# Patient Record
Sex: Male | Born: 1950 | Race: White | Hispanic: No | Marital: Married | Smoking: Never smoker
Health system: Southern US, Community
[De-identification: ages and names within clinical notes are randomized; demographics above are authoritative.]

---

## 2021-09-11 ENCOUNTER — Encounter: Payer: Self-pay | Admitting: Emergency Medicine

## 2021-09-11 ENCOUNTER — Emergency Department (INDEPENDENT_AMBULATORY_CARE_PROVIDER_SITE_OTHER)
Admission: EM | Admit: 2021-09-11 | Discharge: 2021-09-11 | Disposition: A | Discharge status: Home / Self Care | Payer: Medicare Other | Source: Home / Self Care | Attending: Family Medicine | Admitting: Family Medicine

## 2021-09-11 ENCOUNTER — Emergency Department (INDEPENDENT_AMBULATORY_CARE_PROVIDER_SITE_OTHER): Payer: Medicare Other

## 2021-09-11 DIAGNOSIS — L089 Local infection of the skin and subcutaneous tissue, unspecified: Secondary | ICD-10-CM | POA: Diagnosis not present

## 2021-09-11 DIAGNOSIS — E11628 Type 2 diabetes mellitus with other skin complications: Secondary | ICD-10-CM | POA: Diagnosis not present

## 2021-09-11 DIAGNOSIS — E11621 Type 2 diabetes mellitus with foot ulcer: Secondary | ICD-10-CM

## 2021-09-11 MED ORDER — DOXYCYCLINE HYCLATE 100 MG PO CAPS: 100.0000 mg | ORAL_CAPSULE | Freq: Two times a day (BID) | ORAL | 0 refills | Status: AC

## 2021-09-11 NOTE — Discharge Instructions (Signed)
Wash once or twice a day and change dressing Continue Levaquin until completed Add Vibramycin 1 pill 2 times a day.  It is important to take Vibramycin with food Consider adding a probiotic See your doctor as soon as you are home See any physician if you start getting worse instead of better, have increased swelling or pain, or have fever, uncontrolled sugars

## 2021-09-11 NOTE — ED Triage Notes (Signed)
Patient c/o left big toe infection x 7 days, redness, swelling and pain.  Patient has seen in PCP, given antibiotics and Mupirocin, not any better.

## 2021-09-11 NOTE — ED Provider Notes (Signed)
[facility]    CSN: [id number] Arrival date & time: 09/11/21  1308      History   Chief Complaint Chief Complaint  Patient presents with   Toe Pain    HPI [name] is a 71 y.o. male.   HPI  Pleasant 71 year old gentleman.  He and his wife are traveling in a camper.  He lives in [place].  He is on his way through [place] to a destination towards [place].  Stopping here today because he has a pain and swelling in his great toe.  He states that his left big toe has had an infection for 7 days.  He saw his PCP before he left on the trip.  He started him on Levaquin 750 mg a day.  He has been taking this every day for the last 5 or 6 days and the redness appears are getting worse, swelling is worse.  He states that he does not have any open wound.  He does not think that there is any body.  He called his primary care doctor who suggested that he be seen, and to get an x-ray.  He states his diabetes has been well controlled with a recent hemoglobin A1c of 6.7 Patient denies any fever or malaise or systemic symptoms History reviewed. No pertinent past medical history.  There are no problems to display for this patient.   History reviewed. No pertinent surgical history.     Home Medications    Prior to Admission medications   Medication Sig Start Date End Date Taking? Authorizing Provider  doxycycline (VIBRAMYCIN) 100 MG capsule Take 1 capsule (100 mg total) by mouth 2 (two) times daily. 09/11/21  Yes Troy Moore, MD  FARXIGA 10 MG TABS tablet Take 10 mg by mouth daily. 08/10/21  Yes [provider]  lisinopril (ZESTRIL) 5 MG tablet Take 5 mg by mouth daily. 08/10/21  Yes [provider]  meloxicam (MOBIC) 15 MG tablet Take 15 mg by mouth daily. 08/15/21  Yes [provider]  metFORMIN (GLUCOPHAGE) 500 MG tablet Take by mouth.   Yes [provider]  mupirocin ointment (BACTROBAN) 2 % Apply 1 Application topically 3  (three) times daily. 09/05/21  Yes [provider]  sildenafil (VIAGRA) 100 MG tablet Take 100 mg by mouth daily as needed. 04/05/21  Yes [provider]  tamsulosin (FLOMAX) 0.4 MG CAPS capsule Take 0.4 mg by mouth 2 (two) times daily. 07/17/21  Yes [provider]  zolpidem (AMBIEN) 10 MG tablet Take 10 mg by mouth daily. 09/05/21  Yes [provider]    Family History History reviewed. No pertinent family history.  Social History Social History   Tobacco Use   Smoking status: Never   Smokeless tobacco: Never  Substance Use Topics   Alcohol use: Never   Drug use: Never     Allergies   Patient has no known allergies.   Review of Systems Review of Systems See HPI  Physical Exam Triage Vital Signs ED Triage Vitals  Enc Vitals Group     BP 09/11/21 1327 129/81     Pulse Rate 09/11/21 1327 62     Resp 09/11/21 1327 18     Temp 09/11/21 1327 98.8 F (37.1 C)     Temp Source 09/11/21 1327 Oral     SpO2 09/11/21 1327 97 %     Weight 09/11/21 1329 168 lb (76.2 kg)     Height 09/11/21 1329 5\' 8"  (  1.727 m)     Head Circumference --      Peak Flow --      Pain Score 09/11/21 1329 3     Pain Loc --      Pain Edu? --      Excl. in GC? --    No data found.  Updated Vital Signs BP 129/81 (BP Location: Right Arm)   Pulse 62   Temp 98.8 F (37.1 C) (Oral)   Resp 18   Ht 5\' 8"  (1.727 m)   Wt 76.2 kg   SpO2 97%   BMI 25.54 kg/m        Physical Exam Constitutional:      General: He is not in acute distress.    Appearance: He is well-developed.  HENT:     Head: Normocephalic and atraumatic.  Eyes:     Conjunctiva/sclera: Conjunctivae normal.     Pupils: Pupils are equal, round, and reactive to light.  Cardiovascular:     Rate and Rhythm: Normal rate.  Pulmonary:     Effort: Pulmonary effort is normal. No respiratory distress.  Abdominal:     General: There is no distension.     Palpations: Abdomen is soft.  Musculoskeletal:         General: Normal range of motion.     Cervical back: Normal range of motion.       Feet:  Skin:    General: Skin is warm and dry.  Neurological:     General: No focal deficit present.     Mental Status: He is alert.  Psychiatric:        Mood and Affect: Mood normal.        Behavior: Behavior normal.      UC Treatments / Results  Labs (all labs ordered are listed, but only abnormal results are displayed) Labs Reviewed - No data to display  EKG    Radiology DG Foot 2 Views Left  Result Date: 09/11/2021 CLINICAL DATA:  Left big toe infection. EXAM: LEFT FOOT - 2 VIEW COMPARISON:  None Available. FINDINGS: There is no evidence of fracture or dislocation. There is no evidence of arthropathy or other focal bone abnormality. Diffuse soft tissue swelling of the first toe is noted suggesting cellulitis. IMPRESSION: Diffuse soft tissue swelling of the first toe suggesting cellulitis. No lytic destruction is seen to suggest osteomyelitis. Electronically Signed   By: 09/13/2021 M.D.   On: 09/11/2021 14:18    Procedures Procedures (including critical care time)  Medications Ordered in UC Medications - No data to display  Initial Impression / Assessment and Plan / UC Course  I have reviewed the triage vital signs and the nursing notes.  Pertinent labs & imaging results that were available during my care of the patient were reviewed by me and considered in my medical decision making (see chart for details).     Patient has soft tissue swelling and pain in the great toe with evidence of ulceration and infection.  He is not responding to 6 days of Levaquin 750 mg a day.  He does not have good MRSA coverage, so we will add doxycycline.  He needs to follow-up with his primary care doctor as soon as he is able.  He needs to obtain emergency care if his condition worsens. Final Clinical Impressions(s) / UC Diagnoses   Final diagnoses:  Diabetic foot infection (HCC)  Diabetic  ulcer of toe of left foot associated with type 2 diabetes mellitus,  limited to breakdown of skin [facility])     Discharge Instructions      Wash once or twice a day and change dressing Continue Levaquin until completed Add Vibramycin 1 pill 2 times a day.  It is important to take Vibramycin with food Consider adding a probiotic See your doctor as soon as you are home See any physician if you start getting worse instead of better, have increased swelling or pain, or have fever, uncontrolled sugars   ED Prescriptions     Medication Sig Dispense Auth. Provider   doxycycline (VIBRAMYCIN) 100 MG capsule Take 1 capsule (100 mg total) by mouth 2 (two) times daily. 14 capsule Troy Moore, MD      PDMP not reviewed this encounter.   Troy Moore, MD 09/11/21 1744

## 2023-03-04 IMAGING — DX DG FOOT 2V*L*
3 series · 3 of 3 positions shown · non-contrast
Comparison: None Available.

CLINICAL DATA: Left big toe infection.

EXAM:
LEFT FOOT - 2 VIEW

[foot ap]
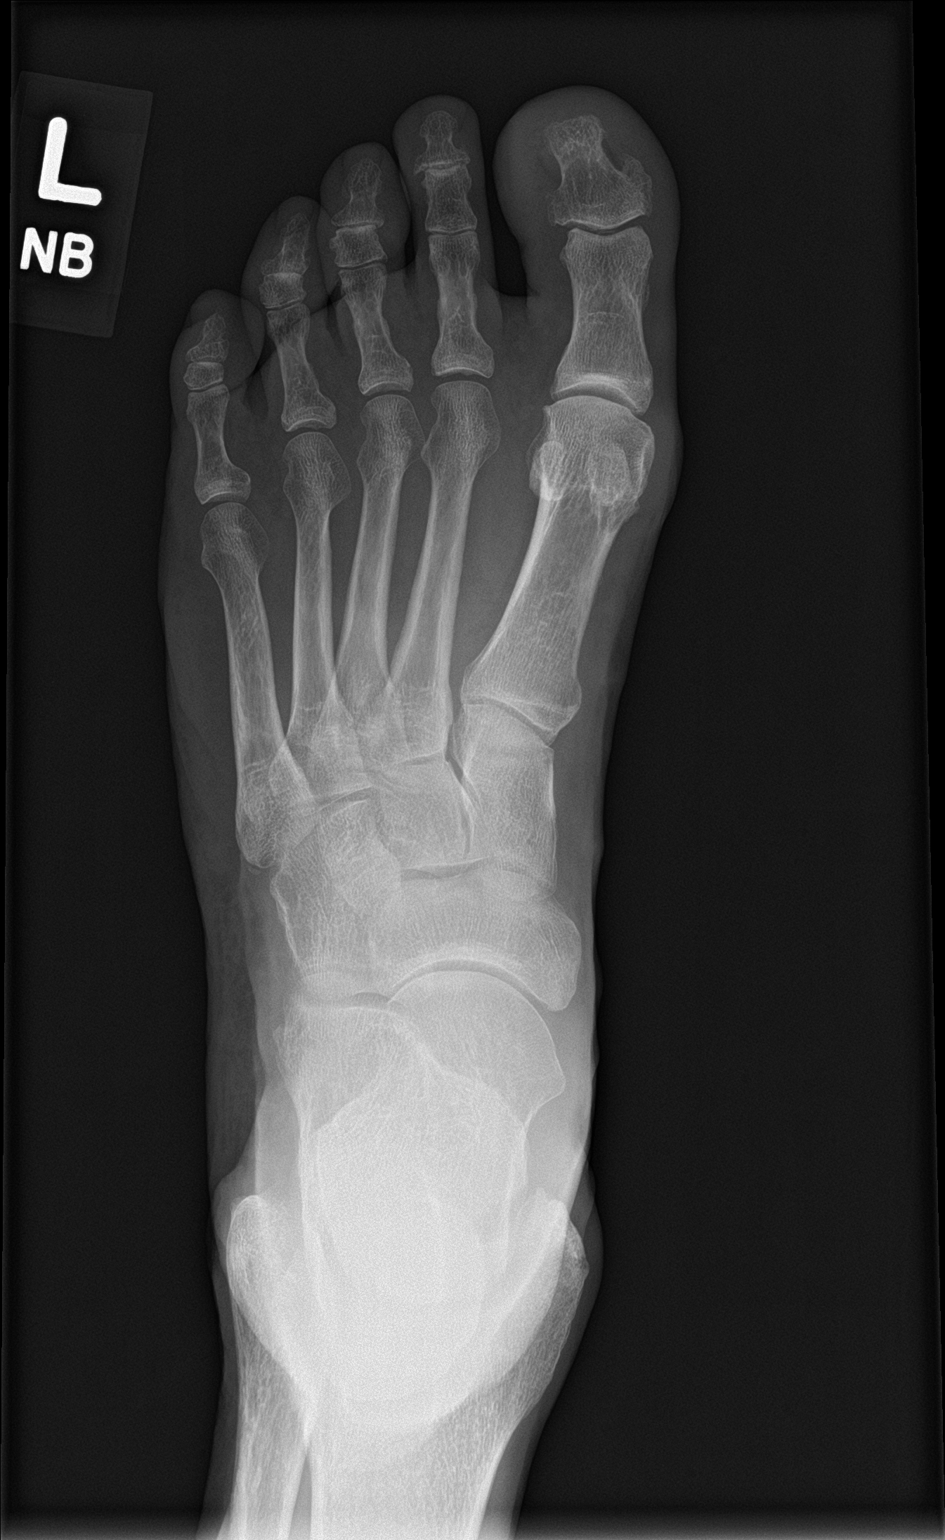

[foot lat]
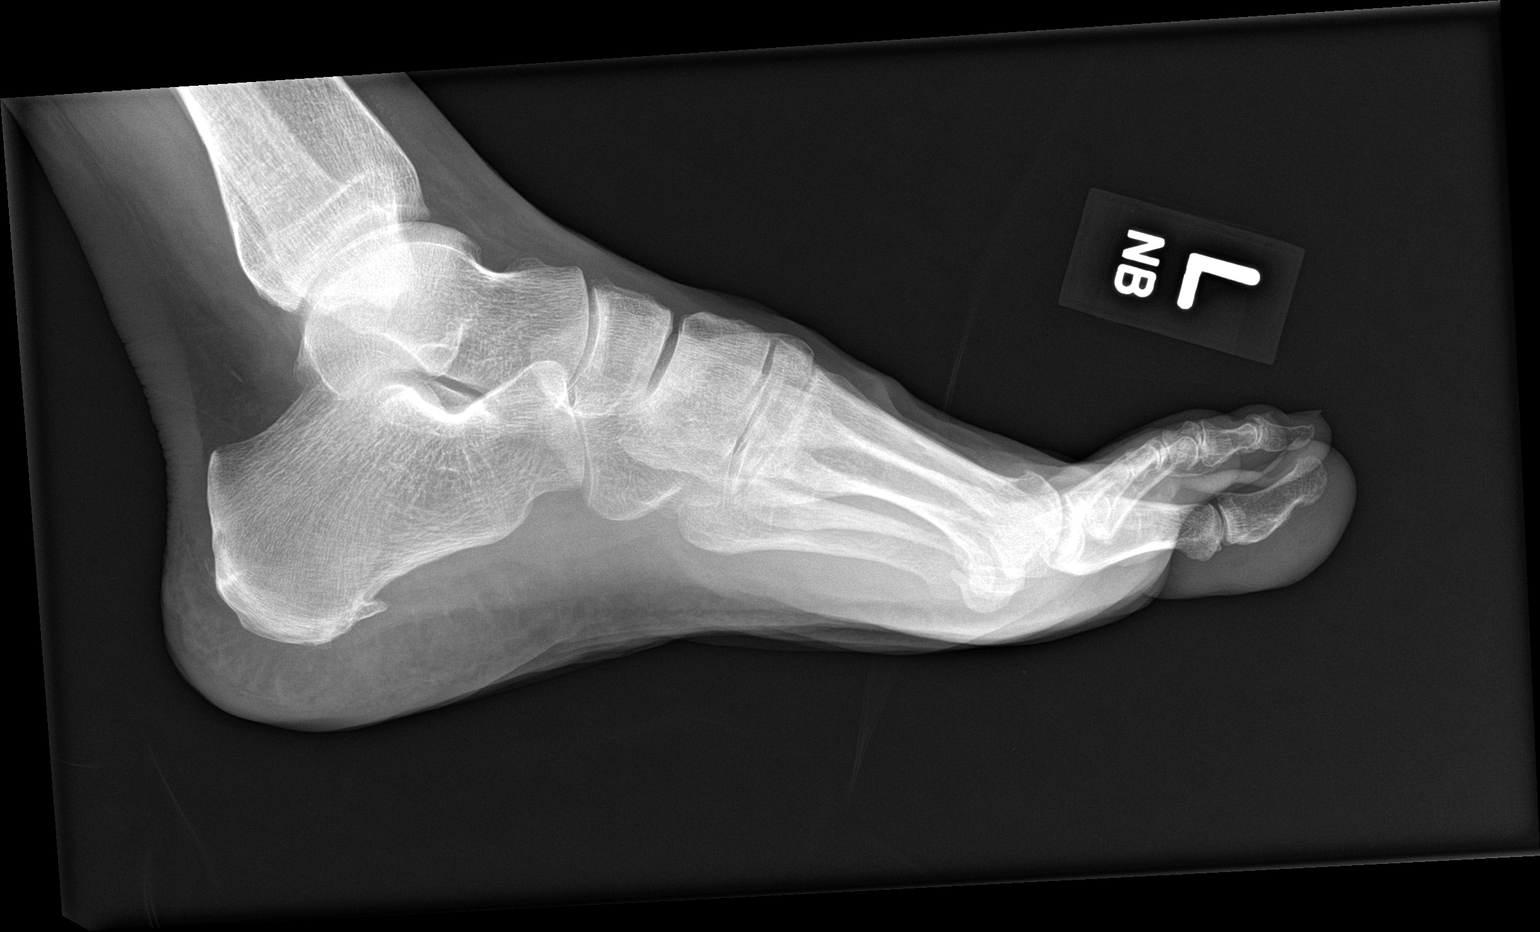

[toe lat]
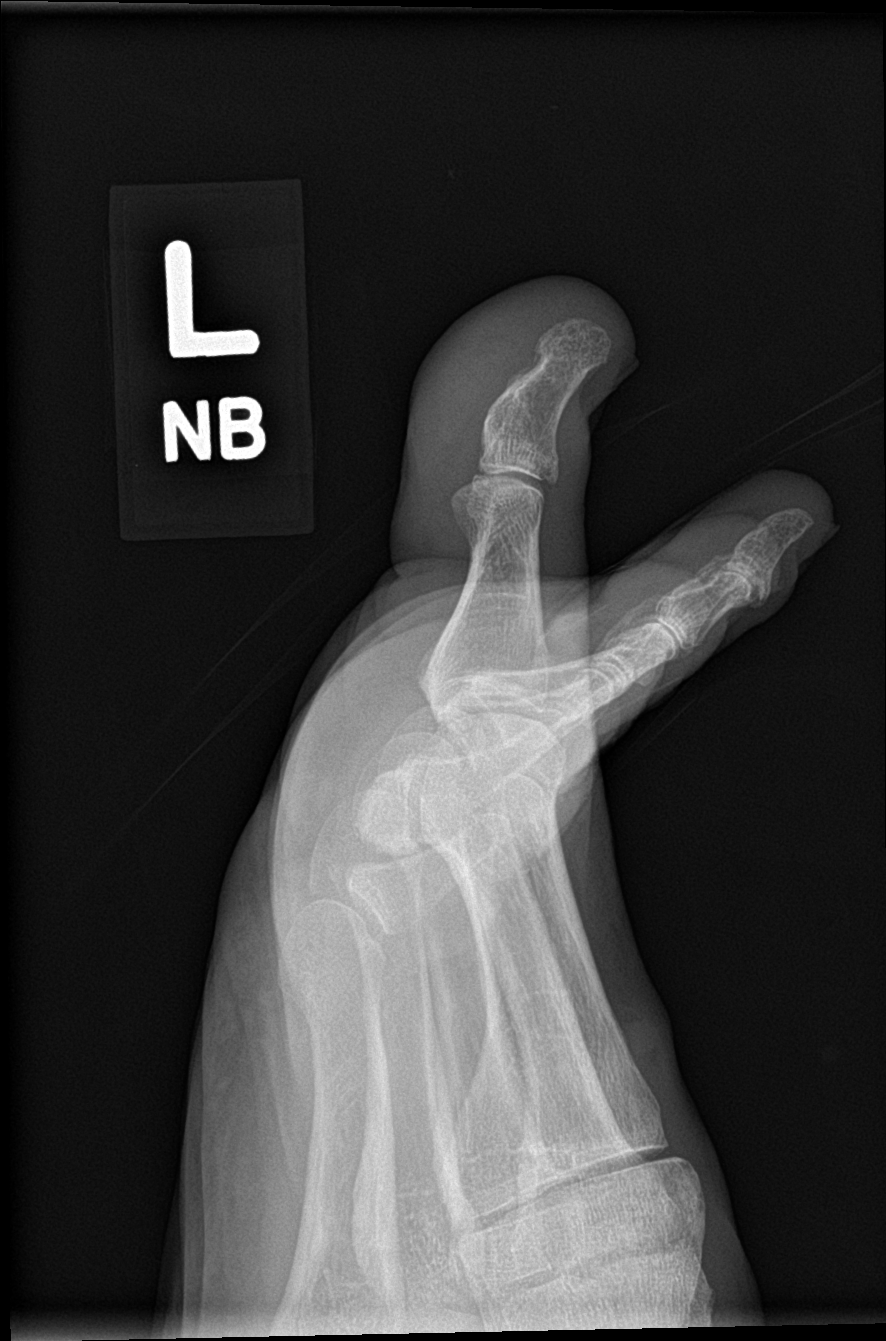

[3 of 3 positions shown; findings below may reference images not displayed]

FINDINGS: There is no evidence of fracture or dislocation. There is no
evidence of arthropathy or other focal bone abnormality. Diffuse
soft tissue swelling of the first toe is noted suggesting
cellulitis.
IMPRESSION: Diffuse soft tissue swelling of the first toe suggesting cellulitis.
No lytic destruction is seen to suggest osteomyelitis.
